# Patient Record
Sex: Female | Born: 2000 | Race: White | Hispanic: No | Marital: Single | Smoking: Never smoker
Health system: Southern US, Community
[De-identification: ages and names within clinical notes are randomized; demographics above are authoritative.]

## PROBLEM LIST (undated history)

## (undated) DIAGNOSIS — J45909 Unspecified asthma, uncomplicated: Secondary | ICD-10-CM

## (undated) HISTORY — PX: ADENOIDECTOMY: SUR15

## (undated) HISTORY — DX: Unspecified asthma, uncomplicated: J45.909

## (undated) HISTORY — PX: TONSILLECTOMY: SUR1361

---
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
## 2022-01-02 DIAGNOSIS — F411 Generalized anxiety disorder: Secondary | ICD-10-CM | POA: Diagnosis not present

## 2022-01-02 DIAGNOSIS — F331 Major depressive disorder, recurrent, moderate: Secondary | ICD-10-CM | POA: Diagnosis not present

## 2022-01-04 ENCOUNTER — Encounter: Payer: Self-pay | Admitting: Nurse Practitioner

## 2022-01-04 ENCOUNTER — Ambulatory Visit: Payer: BC Managed Care – PPO | Admitting: Nurse Practitioner

## 2022-01-04 VITALS — BP 124/77 | HR 118 | Temp 98.8°F | Ht 66.0 in | Wt 225.0 lb

## 2022-01-04 DIAGNOSIS — N764 Abscess of vulva: Secondary | ICD-10-CM

## 2022-01-04 NOTE — Patient Instructions (Signed)
Skin Abscess  A skin abscess is an infected area of your skin that contains pus and other material. An abscess can happen in any part of your body. Some abscesses break open (rupture) on their own. Most continue to get worse unless they are treated. The infection can spread deeper into the body and into your blood, which can make you feel sick. A skin abscess is caused by germs that enter the skin through a cut or scrape. It can also be caused by blocked oil and sweat glands or infected hair follicles. This condition is usually treated by: Draining the pus. Taking antibiotic medicines. Placing a warm, wet washcloth over the abscess. Follow these instructions at home: Medicines  Take over-the-counter and prescription medicines only as told by your doctor. If you were prescribed an antibiotic medicine, take it as told by your doctor. Do not stop taking the antibiotic even if you start to feel better. Abscess care  If you have an abscess that has not drained, place a warm, clean, wet washcloth over the abscess several times a day. Do this as told by your doctor. Follow instructions from your doctor about how to take care of your abscess. Make sure you: Cover the abscess with a bandage (dressing). Change your bandage or gauze as told by your doctor. Wash your hands with soap and water before you change the bandage or gauze. If you cannot use soap and water, use hand sanitizer. Check your abscess every day for signs that the infection is getting worse. Check for: More redness, swelling, or pain. More fluid or blood. Warmth. More pus or a bad smell. General instructions To avoid spreading the infection: Do not share personal care items, towels, or hot tubs with others. Avoid making skin-to-skin contact with other people. Keep all follow-up visits as told by your doctor. This is important. Contact a doctor if: You have more redness, swelling, or pain around your abscess. You have more fluid  or blood coming from your abscess. Your abscess feels warm when you touch it. You have more pus or a bad smell coming from your abscess. Your muscles ache. You feel sick. Get help right away if: You have very bad (severe) pain. You see red streaks on your skin spreading away from the abscess. You see redness that spreads quickly. You have a fever or chills. Summary A skin abscess is an infected area of your skin that contains pus and other material. The abscess is caused by germs that enter the skin through a cut or scrape. It can also be caused by blocked oil and sweat glands or infected hair follicles. Follow your doctor's instructions on caring for your abscess, taking medicines, preventing infections, and keeping follow-up visits. This information is not intended to replace advice given to you by your health care provider. Make sure you discuss any questions you have with your health care provider. Document Revised: 04/02/2021 Document Reviewed: 04/02/2021 Elsevier Patient Education  2023 Elsevier Inc.  

## 2022-01-04 NOTE — Progress Notes (Signed)
Acute Office Visit  Subjective:     Patient ID: Christine Pope, female    DOB: 2000-10-18, 21 y.o.   MRN: [id number]  Chief Complaint  Patient presents with   Follow-up    HPI   Patient is in today for follow up skin abscess on left labia.  We reviewed the etiology of recurrent abscesses of skin.  Skin abscesses are collections of pus within the dermis and deeper skin tissues. Skin abscesses manifest as painful, tender, fluctuant, and erythematous nodules, frequently surmounted by a pustule and surrounded by a rim of erythematous swelling.  Spontaneous drainage of purulent material may occur.  Fever can occur on occasion.   -Skin abscesses can develop in healthy individuals with no predisposing conditions other than skin or nasal carriage of Staphylococcus aureus.    In addition, any process leading to a breach in the skin barrier can also predispose to the development of a skin abscesses, such as atopic dermatitis.      Review of Systems  Constitutional: Negative.  Negative for chills and fever.  HENT: Negative.    Respiratory: Negative.    Cardiovascular: Negative.   Gastrointestinal: Negative.   Genitourinary: Negative.   Skin: Negative.        Abscess  All other systems reviewed and are negative.       Objective:    BP 124/77   Pulse (!) 118   Temp 98.8 F (37.1 C)   Ht 5\' 6"  (1.676 m)   Wt 225 lb (102.1 kg)   SpO2 96%   BMI 36.32 kg/m  BP Readings from Last 3 Encounters:  01/04/22 124/77  12/31/21 123/84  01/05/21 (!) 141/92 (>99 %, Z >2.33 /  >99 %, Z >2.33)*   *BP percentiles are based on the 2017 AAP Clinical Practice Guideline for girls   Wt Readings from Last 3 Encounters:  01/04/22 225 lb (102.1 kg) (99 %, Z= 2.25)*  12/31/21 228 lb (103.4 kg) (99 %, Z= 2.28)*  01/05/21 (!) 228 lb (103.4 kg) (99 %, Z= 2.28)*   * Growth percentiles are based on CDC (Girls, 2-20 Years) data.      Physical Exam Vitals and nursing note reviewed.  Constitutional:       Appearance: Normal appearance.  HENT:     Head: Normocephalic.     Right Ear: External ear normal.     Left Ear: External ear normal.     Nose: Nose normal.  Eyes:     Conjunctiva/sclera: Conjunctivae normal.  Cardiovascular:     Pulses: Normal pulses.     Heart sounds: Normal heart sounds.  Pulmonary:     Effort: Pulmonary effort is normal.     Breath sounds: Normal breath sounds.  Abdominal:     General: Bowel sounds are normal.  Skin:    Findings: Abscess and erythema present.  Neurological:     Mental Status: She is alert and oriented to person, place, and time.     No results found for any visits on 01/04/22.      Assessment & Plan:  Abscess resolved.  Education provided printed handouts given.  Advised patient to complete antibiotic as prescribed.  For mild nausea continue Zofran as these are GI side effects from taking antibiotics.  Patient verbalized understanding and knows to follow-up with worsening unresolved symptoms. Problem List Items Addressed This Visit   None Visit Diagnoses     Abscess of left genital labia    -  Primary  No orders of the defined types were placed in this encounter.   Return if symptoms worsen or fail to improve.  Ivy Lynn, NP

## 2022-01-14 ENCOUNTER — Other Ambulatory Visit: Payer: Self-pay | Admitting: Nurse Practitioner

## 2022-01-14 DIAGNOSIS — M26623 Arthralgia of bilateral temporomandibular joint: Secondary | ICD-10-CM

## 2022-01-16 DIAGNOSIS — F331 Major depressive disorder, recurrent, moderate: Secondary | ICD-10-CM | POA: Diagnosis not present

## 2022-01-16 DIAGNOSIS — F411 Generalized anxiety disorder: Secondary | ICD-10-CM | POA: Diagnosis not present

## 2022-01-22 DIAGNOSIS — M79671 Pain in right foot: Secondary | ICD-10-CM | POA: Diagnosis not present

## 2022-01-22 DIAGNOSIS — M2142 Flat foot [pes planus] (acquired), left foot: Secondary | ICD-10-CM | POA: Diagnosis not present

## 2022-01-22 DIAGNOSIS — M2141 Flat foot [pes planus] (acquired), right foot: Secondary | ICD-10-CM | POA: Diagnosis not present

## 2022-01-22 DIAGNOSIS — M79672 Pain in left foot: Secondary | ICD-10-CM | POA: Diagnosis not present

## 2022-01-24 DIAGNOSIS — F411 Generalized anxiety disorder: Secondary | ICD-10-CM | POA: Diagnosis not present

## 2022-01-24 DIAGNOSIS — F331 Major depressive disorder, recurrent, moderate: Secondary | ICD-10-CM | POA: Diagnosis not present

## 2022-01-29 DIAGNOSIS — F411 Generalized anxiety disorder: Secondary | ICD-10-CM | POA: Diagnosis not present

## 2022-01-29 DIAGNOSIS — F331 Major depressive disorder, recurrent, moderate: Secondary | ICD-10-CM | POA: Diagnosis not present

## 2022-01-31 ENCOUNTER — Encounter: Payer: Self-pay | Admitting: Nurse Practitioner

## 2022-01-31 ENCOUNTER — Ambulatory Visit: Payer: BC Managed Care – PPO | Admitting: Nurse Practitioner

## 2022-01-31 ENCOUNTER — Other Ambulatory Visit: Payer: Self-pay | Admitting: Nurse Practitioner

## 2022-01-31 VITALS — BP 133/87 | HR 99 | Temp 97.8°F | Resp 20 | Ht 66.0 in | Wt 223.0 lb

## 2022-01-31 DIAGNOSIS — R7982 Elevated C-reactive protein (CRP): Secondary | ICD-10-CM

## 2022-01-31 DIAGNOSIS — M2550 Pain in unspecified joint: Secondary | ICD-10-CM

## 2022-01-31 DIAGNOSIS — M26623 Arthralgia of bilateral temporomandibular joint: Secondary | ICD-10-CM

## 2022-01-31 DIAGNOSIS — M26603 Bilateral temporomandibular joint disorder, unspecified: Secondary | ICD-10-CM

## 2022-01-31 DIAGNOSIS — S0300XD Dislocation of jaw, unspecified side, subsequent encounter: Secondary | ICD-10-CM

## 2022-01-31 DIAGNOSIS — K219 Gastro-esophageal reflux disease without esophagitis: Secondary | ICD-10-CM

## 2022-01-31 MED ORDER — PANTOPRAZOLE SODIUM 20 MG PO TBEC: 20.0000 mg | DELAYED_RELEASE_TABLET | Freq: Every day | ORAL | 2 refills | Status: AC

## 2022-01-31 NOTE — Patient Instructions (Signed)
C-Reactive Protein Test Why am I having this test? The C-reactive protein (CRP) is a substance that the liver releases in response to inflammation within the body. You may have a CRP test to help diagnose: Serious bacterial or fungal infections. Inflammatory diseases, such as inflammatory bowel disease. Lupus, rheumatoid arthritis, or other autoimmune diseases. What is being tested? This test checks for the level of CRP in your blood. The level of CRP in your body increases greatly after a heart attack, infection, or injury. What kind of sample is taken?  A blood sample is required for this test. It is usually collected by inserting a needle into a blood vessel. Tell a health care provider about: All medicines you are taking, including vitamins, herbs, eye drops, creams, and over-the-counter medicines. Any medical conditions you have. The use of birth control pills or hormone replacement therapy such as estrogen. Any blood disorders you have. Whether you are pregnant or may be pregnant. How are the results reported? Your test results will be reported as a value that indicates how much CRP is in your blood. This will be reported as milligrams of CRP per liter (mg/L) of blood. Your health care provider will compare your results to normal ranges that were established after testing a large group of people (reference ranges). Reference ranges may vary among labs and hospitals. For this test, the standard CRP reference value is less than 10 mg/L. What do the results mean? A standard CRP test result that is less than 10 mg/L is considered normal, meaning that you do not have an abnormally high level of inflammation in your body. A standard CRP test result that is greater than 10 mg/L means that there is an abnormally high level of inflammation in your body that is causing CRP to be released. Inflammation may result from many different conditions or injuries. You will have more tests to help make a  diagnosis. Talk with your health care provider about what your results mean. Questions to ask your health care provider Ask your health care provider, or the department that is doing the test: When will my results be ready? How will I get my results? What are my treatment options? What other tests do I need? What are my next steps? Summary C-reactive protein (CPR) is a substance released by the liver in response to inflammation within the body. The CRP test may be performed to help diagnose infection and inflammatory conditions. Talk with your health care provider about what your results mean. This information is not intended to replace advice given to you by your health care provider. Make sure you discuss any questions you have with your health care provider. Document Revised: 04/19/2020 Document Reviewed: 04/19/2020 Elsevier Patient Education  2023 [employer].

## 2022-01-31 NOTE — Progress Notes (Signed)
Subjective:    Patient ID: Christine Pope, female    DOB: 10-04-2000, 21 y.o.   MRN: [id number]   Chief Complaint: GERD  HPI Patient has several complaints:  -gerd.-She is currently on no medication for this. She has been on flexeril daily for her jaw pain and now she is having heart burn, feels like she cant eat, and occasional abdominal pain. 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 Even though hypermobility has nothing tio do with her stomach. She has had endoscopies in the past. 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 Flexeril is the only thing that helps her jaw pain. - frequent fatigue- says that if she goes out an about 1 day then she has to sleep for days afterwards to recover.     Review of Systems  Constitutional:  Negative for diaphoresis.  Eyes:  Negative for pain.  Respiratory:  Negative for shortness of breath.   Cardiovascular:  Negative for chest pain, palpitations and leg swelling.  Gastrointestinal:  Negative for abdominal pain.  Endocrine: Negative for polydipsia.  Skin:  Negative for rash.  Neurological:  Negative for dizziness, weakness and headaches.  Hematological:  Does not bruise/bleed easily.  All other systems reviewed and are negative.      Objective:   Physical Exam Vitals and nursing note reviewed.  Constitutional:      General: She is not in acute distress.    Appearance: Normal appearance. She is well-developed.  HENT:     Head: Normocephalic.     Right Ear: Tympanic membrane normal.     Left Ear: Tympanic membrane normal.     Ears:     Comments: Bil jaw pain on opening and closing of jaw- crepitus felt.    Nose: Nose normal.     Mouth/Throat:     Mouth: Mucous membranes are moist.  Eyes:     Pupils: Pupils are equal, round, and reactive to light.  Neck:     Vascular: No carotid bruit or JVD.  Cardiovascular:     Rate and Rhythm: Normal rate and regular rhythm.      Heart sounds: Normal heart sounds.  Pulmonary:     Effort: Pulmonary effort is normal. No respiratory distress.     Breath sounds: Normal breath sounds. No wheezing or rales.  Chest:     Chest wall: No tenderness.  Abdominal:     General: Bowel sounds are normal. There is no distension or abdominal bruit.     Palpations: Abdomen is soft. There is no hepatomegaly, splenomegaly, mass or pulsatile mass.     Tenderness: There is no abdominal tenderness.  Musculoskeletal:        General: Normal range of motion.     Cervical back: Normal range of motion and neck supple.  Lymphadenopathy:     Cervical: No cervical adenopathy.  Skin:    General: Skin is warm and dry.  Neurological:     Mental Status: She is alert and oriented to person, place, and time.     Deep Tendon Reflexes: Reflexes are normal and symmetric.  Psychiatric:        Behavior: Behavior normal.        Thought Content: Thought content normal.        Judgment: Judgment normal.     BP 133/87   Pulse 99   Temp 97.8 F (36.6 C) (Temporal)   Resp 20   Ht  $'5\' 6"'R$  (1.676 m)   Wt 223 lb (101.2 kg)   SpO2 94%   BMI 35.99 kg/m        Assessment & Plan:   Christine Pope in today with chief complaint of Medical Management of Chronic Issues   1. Hypermobility arthralgia Labs pending - Ambulatory referral to Rheumatology - CBC with Differential/Platelet - CMP14+EGFR  2. Gastroesophageal reflux disease without esophagitis Avoid spicy foods Do not eat 2 hours prior to bedtime Only take flexeril at night and see how you do - pantoprazole (PROTONIX) 20 MG tablet; Take 1 tablet (20 mg total) by mouth daily.  Dispense: 30 tablet; Refill: 2  3. Elevated C-reactive protein (CRP) Labs pending - C-reactive protein  4. Dislocation of temporomandibular joint, subsequent encounter Avoid foods that require a lot of chewing Avoid gum Ice as needed - Ambulatory referral to Oral Maxillofacial Surgery    The above  assessment and management plan was discussed with the patient. The patient verbalized understanding of and has agreed to the management plan. Patient is aware to call the clinic if symptoms persist or worsen. Patient is aware when to return to the clinic for a follow-up visit. Patient educated on when it is appropriate to go to the emergency department.   Mary-Margaret Hassell Done, FNP

## 2022-02-01 LAB — CBC WITH DIFFERENTIAL/PLATELET
Basophils Absolute: 0.1 10*3/uL (ref 0.0–0.2)
Basos: 1 %
EOS (ABSOLUTE): 0.1 10*3/uL (ref 0.0–0.4)
Eos: 1 %
Hematocrit: 36.7 % (ref 34.0–46.6)
Hemoglobin: 11.9 g/dL (ref 11.1–15.9)
Immature Grans (Abs): 0 10*3/uL (ref 0.0–0.1)
Immature Granulocytes: 0 %
Lymphocytes Absolute: 3.8 10*3/uL — ABNORMAL HIGH (ref 0.7–3.1)
Lymphs: 37 %
MCH: 26.4 pg — ABNORMAL LOW (ref 26.6–33.0)
MCHC: 32.4 g/dL (ref 31.5–35.7)
MCV: 82 fL (ref 79–97)
Monocytes Absolute: 0.5 10*3/uL (ref 0.1–0.9)
Monocytes: 4 %
Neutrophils Absolute: 5.9 10*3/uL (ref 1.4–7.0)
Neutrophils: 57 %
Platelets: 413 10*3/uL (ref 150–450)
RBC: 4.5 x10E6/uL (ref 3.77–5.28)
RDW: 14.9 % (ref 11.7–15.4)
WBC: 10.4 10*3/uL (ref 3.4–10.8)

## 2022-02-01 LAB — CMP14+EGFR
ALT: 24 IU/L (ref 0–32)
AST: 13 IU/L (ref 0–40)
Albumin/Globulin Ratio: 1.6 (ref 1.2–2.2)
Albumin: 4.2 g/dL (ref 4.0–5.0)
Alkaline Phosphatase: 99 IU/L (ref 42–106)
BUN/Creatinine Ratio: 12 (ref 9–23)
BUN: 9 mg/dL (ref 6–20)
Bilirubin Total: <0.2 mg/dL (ref 0.0–1.2)
CO2: 18 mmol/L — ABNORMAL LOW (ref 20–29)
Calcium: 9.2 mg/dL (ref 8.7–10.2)
Chloride: 105 mmol/L (ref 96–106)
Creatinine, Ser: 0.78 mg/dL (ref 0.57–1.00)
Globulin, Total: 2.6 g/dL (ref 1.5–4.5)
Glucose: 97 mg/dL (ref 70–99)
Potassium: 4.2 mmol/L (ref 3.5–5.2)
Sodium: 139 mmol/L (ref 134–144)
Total Protein: 6.8 g/dL (ref 6.0–8.5)
eGFR: 113 mL/min/{1.73_m2} (ref >59)

## 2022-02-01 LAB — C-REACTIVE PROTEIN: CRP: 9 mg/L (ref 0–10)

## 2022-02-08 DIAGNOSIS — F331 Major depressive disorder, recurrent, moderate: Secondary | ICD-10-CM | POA: Diagnosis not present

## 2022-02-08 DIAGNOSIS — F411 Generalized anxiety disorder: Secondary | ICD-10-CM | POA: Diagnosis not present

## 2022-02-11 DIAGNOSIS — M25561 Pain in right knee: Secondary | ICD-10-CM | POA: Diagnosis not present

## 2022-02-11 DIAGNOSIS — M25562 Pain in left knee: Secondary | ICD-10-CM | POA: Diagnosis not present

## 2022-02-11 DIAGNOSIS — M222X1 Patellofemoral disorders, right knee: Secondary | ICD-10-CM | POA: Diagnosis not present

## 2022-02-11 DIAGNOSIS — M2241 Chondromalacia patellae, right knee: Secondary | ICD-10-CM | POA: Diagnosis not present

## 2022-02-11 DIAGNOSIS — M2242 Chondromalacia patellae, left knee: Secondary | ICD-10-CM | POA: Diagnosis not present

## 2022-02-11 DIAGNOSIS — M222X2 Patellofemoral disorders, left knee: Secondary | ICD-10-CM | POA: Diagnosis not present

## 2022-02-13 DIAGNOSIS — F411 Generalized anxiety disorder: Secondary | ICD-10-CM | POA: Diagnosis not present

## 2022-02-13 DIAGNOSIS — F331 Major depressive disorder, recurrent, moderate: Secondary | ICD-10-CM | POA: Diagnosis not present

## 2022-02-14 DIAGNOSIS — M222X2 Patellofemoral disorders, left knee: Secondary | ICD-10-CM | POA: Diagnosis not present

## 2022-02-14 DIAGNOSIS — M2241 Chondromalacia patellae, right knee: Secondary | ICD-10-CM | POA: Diagnosis not present

## 2022-02-14 DIAGNOSIS — M2242 Chondromalacia patellae, left knee: Secondary | ICD-10-CM | POA: Diagnosis not present

## 2022-02-14 DIAGNOSIS — M25562 Pain in left knee: Secondary | ICD-10-CM | POA: Diagnosis not present

## 2022-02-14 DIAGNOSIS — M222X1 Patellofemoral disorders, right knee: Secondary | ICD-10-CM | POA: Diagnosis not present

## 2022-02-14 DIAGNOSIS — M25561 Pain in right knee: Secondary | ICD-10-CM | POA: Diagnosis not present

## 2022-02-22 DIAGNOSIS — F331 Major depressive disorder, recurrent, moderate: Secondary | ICD-10-CM | POA: Diagnosis not present

## 2022-02-22 DIAGNOSIS — F411 Generalized anxiety disorder: Secondary | ICD-10-CM | POA: Diagnosis not present

## 2022-03-01 DIAGNOSIS — F331 Major depressive disorder, recurrent, moderate: Secondary | ICD-10-CM | POA: Diagnosis not present

## 2022-03-01 DIAGNOSIS — F411 Generalized anxiety disorder: Secondary | ICD-10-CM | POA: Diagnosis not present

## 2022-03-06 ENCOUNTER — Other Ambulatory Visit: Payer: Self-pay | Admitting: Nurse Practitioner

## 2022-03-06 DIAGNOSIS — N946 Dysmenorrhea, unspecified: Secondary | ICD-10-CM | POA: Diagnosis not present

## 2022-03-06 DIAGNOSIS — F411 Generalized anxiety disorder: Secondary | ICD-10-CM | POA: Diagnosis not present

## 2022-03-06 DIAGNOSIS — F331 Major depressive disorder, recurrent, moderate: Secondary | ICD-10-CM | POA: Diagnosis not present

## 2022-03-07 DIAGNOSIS — M25562 Pain in left knee: Secondary | ICD-10-CM | POA: Diagnosis not present

## 2022-03-07 DIAGNOSIS — M2242 Chondromalacia patellae, left knee: Secondary | ICD-10-CM | POA: Diagnosis not present

## 2022-03-07 DIAGNOSIS — M25561 Pain in right knee: Secondary | ICD-10-CM | POA: Diagnosis not present

## 2022-03-07 DIAGNOSIS — M222X1 Patellofemoral disorders, right knee: Secondary | ICD-10-CM | POA: Diagnosis not present

## 2022-03-07 DIAGNOSIS — M222X2 Patellofemoral disorders, left knee: Secondary | ICD-10-CM | POA: Diagnosis not present

## 2022-03-07 DIAGNOSIS — M2241 Chondromalacia patellae, right knee: Secondary | ICD-10-CM | POA: Diagnosis not present

## 2022-03-19 DIAGNOSIS — M2550 Pain in unspecified joint: Secondary | ICD-10-CM | POA: Diagnosis not present

## 2022-03-19 DIAGNOSIS — R768 Other specified abnormal immunological findings in serum: Secondary | ICD-10-CM | POA: Diagnosis not present

## 2022-03-19 DIAGNOSIS — R5382 Chronic fatigue, unspecified: Secondary | ICD-10-CM | POA: Diagnosis not present

## 2022-03-19 DIAGNOSIS — M357 Hypermobility syndrome: Secondary | ICD-10-CM | POA: Diagnosis not present

## 2022-04-04 ENCOUNTER — Encounter: Payer: Self-pay | Admitting: Nurse Practitioner

## 2022-04-04 ENCOUNTER — Ambulatory Visit (INDEPENDENT_AMBULATORY_CARE_PROVIDER_SITE_OTHER): Payer: BC Managed Care – PPO | Admitting: Nurse Practitioner

## 2022-04-04 VITALS — BP 127/82 | HR 98 | Temp 95.8°F | Ht 66.01 in | Wt 223.8 lb

## 2022-04-04 DIAGNOSIS — R110 Nausea: Secondary | ICD-10-CM

## 2022-04-04 DIAGNOSIS — M2559 Pain in other specified joint: Secondary | ICD-10-CM

## 2022-04-04 DIAGNOSIS — L299 Pruritus, unspecified: Secondary | ICD-10-CM | POA: Diagnosis not present

## 2022-04-04 DIAGNOSIS — F5101 Primary insomnia: Secondary | ICD-10-CM

## 2022-04-04 MED ORDER — CELECOXIB 200 MG PO CAPS: 200.0000 mg | ORAL_CAPSULE | Freq: Two times a day (BID) | ORAL | 5 refills | Status: DC

## 2022-04-04 MED ORDER — ONDANSETRON 4 MG PO TBDP: 4.0000 mg | ORAL_TABLET | Freq: Three times a day (TID) | ORAL | 0 refills | Status: AC | PRN

## 2022-04-04 MED ORDER — MIRTAZAPINE 15 MG PO TABS: 15.0000 mg | ORAL_TABLET | Freq: Every day | ORAL | 2 refills | Status: DC

## 2022-04-04 NOTE — Patient Instructions (Signed)
SARNA LOTION  Pruritus Pruritus is an itchy feeling on the skin. One of the most common causes is dry skin, but many different things can cause itching. Most cases of itching do not require medical attention. Sometimes itchy skin can turn into a rash or a secondary infection. Follow these instructions at home: Skin care  Do not use scented soaps, detergents, perfumes, and cosmetic products. Instead, use gentle, unscented versions of these items. Apply moisturizing creams to your skin frequently, at least twice daily. Apply immediately after bathing while skin is still wet. Take medicines or apply medicated creams only as told by your health care provider. This may include: Corticosteroid cream or topical calcineurin inhibitor. Anti-itch lotions containing urea, camphor, or menthol. Oral antihistamines. Do not take hot showers or baths, which can make itching worse. A short, cool shower may help with itching as long as you apply moisturizing lotion after the shower. Apply a cool, wet cloth (cool compress) to the affected areas. You may take lukewarm baths with one of the following: Epsom salts. You can get these at your local pharmacy or grocery store. Follow the instructions on the packaging. Baking soda. Pour a small amount into the bath as told by your health care provider. Colloidal oatmeal. You can get this at your local pharmacy or grocery store. Follow the instructions on the packaging. Do not scratch your skin. General instructions Avoid wearing tight clothes. Keep a journal to help find out what is causing your itching. Write down: What you eat and drink. What cosmetic products you use. What soaps or detergents you use. What you wear, including jewelry. Use a humidifier. This keeps the air moist, which helps to prevent dry skin. Be aware of any changes in your itchiness. Tell your health care provider about any changes. Contact a health care provider if: The itching does not go  away after several days. You notice redness, warmth, or drainage on the skin where you have scratched. You are unusually thirsty or urinating more than normal. Your skin tingles or feels numb. Your skin or the white parts of your eyes turn yellow (jaundice). You feel weak. You have any of the following: Night sweats. Tiredness (fatigue). Weight loss. Abdominal pain. Summary Pruritus is an itchy feeling on the skin. One of the most common causes is dry skin, but many different conditions and factors can cause itching. Apply moisturizing creams to your skin frequently, at least twice daily. Apply immediately after bathing while skin is still wet. Take medicines or apply medicated creams only as told by your health care provider. Do not take hot showers or baths. Do not use scented soaps, detergents, perfumes, or cosmetic products. Keep a journal to help find out what is causing your itching. This information is not intended to replace advice given to you by your health care provider. Make sure you discuss any questions you have with your health care provider. Document Revised: 08/01/2021 Document Reviewed: 08/01/2021 Elsevier Patient Education  2023 Elsevier Inc.  

## 2022-04-04 NOTE — Progress Notes (Signed)
Subjective:    Patient ID: Christine Pope, female    DOB: 2000/09/15, 21 y.o.   MRN: [id number]   Chief Complaint: Insomnia and Nausea   Insomnia   Patient is brought in by her mom with 2 complaints: - insomnia- patient says she does not sleep well because of her constant joint pain, she has tried melatonin, naproxyn and biofreeze. She says it is a ache in both legs- she is doing physical therapy. She saw rheumatology and they said there was nothing they could do.  - nausea- she has an appointment with GI in NOvember. She is on zofran and bentyl prescribed by GI.she is out of zofran. She needs refill until see can see them again.    Review of Systems  Constitutional:  Negative for diaphoresis.  Eyes:  Negative for pain.  Respiratory:  Negative for shortness of breath.   Cardiovascular:  Negative for chest pain, palpitations and leg swelling.  Gastrointestinal:  Negative for abdominal pain.  Endocrine: Negative for polydipsia.  Skin:  Negative for rash.  Neurological:  Negative for dizziness, weakness and headaches.  Hematological:  Does not bruise/bleed easily.  Psychiatric/Behavioral:  The patient has insomnia.   All other systems reviewed and are negative.      Objective:   Physical Exam Vitals and nursing note reviewed.  Constitutional:      General: She is not in acute distress.    Appearance: Normal appearance. She is well-developed.  Neck:     Vascular: No carotid bruit or JVD.  Cardiovascular:     Rate and Rhythm: Normal rate and regular rhythm.     Heart sounds: Normal heart sounds.  Pulmonary:     Effort: Pulmonary effort is normal. No respiratory distress.     Breath sounds: Normal breath sounds. No wheezing or rales.  Chest:     Chest wall: No tenderness.  Abdominal:     General: Bowel sounds are normal. There is no distension or abdominal bruit.     Palpations: Abdomen is soft. There is no hepatomegaly, splenomegaly, mass or pulsatile mass.      Tenderness: There is no abdominal tenderness.  Musculoskeletal:        General: Normal range of motion.     Cervical back: Normal range of motion and neck supple.  Lymphadenopathy:     Cervical: No cervical adenopathy.  Skin:    General: Skin is warm and dry.  Neurological:     Mental Status: She is alert and oriented to person, place, and time.     Deep Tendon Reflexes: Reflexes are normal and symmetric.  Psychiatric:        Behavior: Behavior normal.        Thought Content: Thought content normal.        Judgment: Judgment normal.     BP 127/82   Pulse 98   Temp (!) 95.8 F (35.4 C) (Temporal)   Ht 5' 6.01" (1.677 m)   Wt 223 lb 12.8 oz (101.5 kg)   SpO2 96%   BMI 36.11 kg/m        Assessment & Plan:   Christine Pope in today with chief complaint of Insomnia (Not sleeping due to bilateral jaw and knee pain ) and Nausea (X 3 months )   1. Pain in other joint Takes MEDS BID - celecoxib (CELEBREX) 200 MG capsule; Take 1 capsule (200 mg total) by mouth 2 (two) times daily.  Dispense: 60 capsule; Refill: 5  2. Primary insomnia Bedtime  routine - mirtazapine (REMERON) 15 MG tablet; Take 1 tablet (15 mg total) by mouth at bedtime.  Dispense: 30 tablet; Refill: 2  3. Nausea Keep appointmnet with GI - ondansetron (ZOFRAN-ODT) 4 MG disintegrating tablet; Take 1 tablet (4 mg total) by mouth every 8 (eight) hours as needed for nausea or vomiting.  Dispense: 20 tablet; Refill: 0  4. Pruritus Claritin daily - Ambulatory referral to Allergy    The above assessment and management plan was discussed with the patient. The patient verbalized understanding of and has agreed to the management plan. Patient is aware to call the clinic if symptoms persist or worsen. Patient is aware when to return to the clinic for a follow-up visit. Patient educated on when it is appropriate to go to the emergency department.   Mary-Margaret Hassell Done, FNP

## 2022-04-08 DIAGNOSIS — L509 Urticaria, unspecified: Secondary | ICD-10-CM | POA: Diagnosis not present

## 2022-04-08 DIAGNOSIS — L299 Pruritus, unspecified: Secondary | ICD-10-CM | POA: Diagnosis not present

## 2022-04-08 DIAGNOSIS — L700 Acne vulgaris: Secondary | ICD-10-CM | POA: Diagnosis not present

## 2022-04-08 DIAGNOSIS — L739 Follicular disorder, unspecified: Secondary | ICD-10-CM | POA: Diagnosis not present

## 2022-04-29 DIAGNOSIS — M25562 Pain in left knee: Secondary | ICD-10-CM | POA: Diagnosis not present

## 2022-04-29 DIAGNOSIS — M25561 Pain in right knee: Secondary | ICD-10-CM | POA: Diagnosis not present

## 2022-04-29 DIAGNOSIS — M2480 Other specific joint derangements of unspecified joint, not elsewhere classified: Secondary | ICD-10-CM | POA: Diagnosis not present

## 2022-04-29 DIAGNOSIS — M2241 Chondromalacia patellae, right knee: Secondary | ICD-10-CM | POA: Diagnosis not present

## 2022-05-14 DIAGNOSIS — R700 Elevated erythrocyte sedimentation rate: Secondary | ICD-10-CM | POA: Diagnosis not present

## 2022-05-14 DIAGNOSIS — R109 Unspecified abdominal pain: Secondary | ICD-10-CM | POA: Diagnosis not present

## 2022-05-14 DIAGNOSIS — Z1389 Encounter for screening for other disorder: Secondary | ICD-10-CM | POA: Diagnosis not present

## 2022-05-14 DIAGNOSIS — R1084 Generalized abdominal pain: Secondary | ICD-10-CM | POA: Diagnosis not present

## 2022-05-14 DIAGNOSIS — R7982 Elevated C-reactive protein (CRP): Secondary | ICD-10-CM | POA: Diagnosis not present

## 2022-05-14 DIAGNOSIS — R9431 Abnormal electrocardiogram [ECG] [EKG]: Secondary | ICD-10-CM | POA: Diagnosis not present

## 2022-05-16 DIAGNOSIS — I499 Cardiac arrhythmia, unspecified: Secondary | ICD-10-CM | POA: Diagnosis not present

## 2022-05-16 DIAGNOSIS — R1084 Generalized abdominal pain: Secondary | ICD-10-CM | POA: Diagnosis not present

## 2022-05-23 ENCOUNTER — Ambulatory Visit: Payer: BC Managed Care – PPO | Admitting: Nurse Practitioner

## 2022-05-23 ENCOUNTER — Encounter: Payer: Self-pay | Admitting: Nurse Practitioner

## 2022-05-23 VITALS — BP 154/97 | HR 109 | Temp 96.8°F | Resp 20 | Ht 66.0 in | Wt 221.0 lb

## 2022-05-23 DIAGNOSIS — H6123 Impacted cerumen, bilateral: Secondary | ICD-10-CM | POA: Diagnosis not present

## 2022-05-23 DIAGNOSIS — Z23 Encounter for immunization: Secondary | ICD-10-CM | POA: Diagnosis not present

## 2022-05-23 NOTE — Patient Instructions (Signed)

## 2022-05-23 NOTE — Progress Notes (Signed)
   Subjective:    Patient ID: Christine Pope, female    DOB: March 19, 2001, 21 y.o.   MRN: [id number]   Chief Complaint: Right ear feels stopped up   Ear Fullness  There is pain in the right ear. This is a new problem. The current episode started in the past 7 days (3-4 days). The problem occurs constantly. The problem has been gradually worsening. There has been no fever. The patient is experiencing no pain (fullness and pressure). Pertinent negatives include no abdominal pain, coughing, ear discharge, headaches, rhinorrhea or sore throat. Treatments tried: debrox drops. The treatment provided no relief.       Review of Systems  Constitutional:  Negative for chills and fever.  HENT:  Negative for ear discharge, rhinorrhea and sore throat.   Eyes:  Negative for pain.  Respiratory:  Negative for cough and shortness of breath.   Cardiovascular:  Negative for chest pain.  Gastrointestinal:  Negative for abdominal pain.  Neurological:  Negative for headaches.  All other systems reviewed and are negative.      Objective:   Physical Exam Vitals and nursing note reviewed.  Constitutional:      General: She is not in acute distress.    Appearance: Normal appearance. She is not toxic-appearing.  HENT:     Head: Normocephalic and atraumatic.     Right Ear: There is impacted cerumen.     Left Ear: There is impacted cerumen.     Ears:     Comments: After irrigation, mid ear effusion seen in R ear    Nose: Nose normal.     Mouth/Throat:     Mouth: Mucous membranes are moist.     Pharynx: Oropharynx is clear.  Eyes:     Conjunctiva/sclera: Conjunctivae normal.     Pupils: Pupils are equal, round, and reactive to light.  Cardiovascular:     Rate and Rhythm: Regular rhythm.     Pulses: Normal pulses.     Heart sounds: Normal heart sounds.  Pulmonary:     Effort: Pulmonary effort is normal. No respiratory distress.     Breath sounds: Normal breath sounds.  Skin:    General: Skin is  warm and dry.  Neurological:     General: No focal deficit present.     Mental Status: She is alert and oriented to person, place, and time.  Psychiatric:        Mood and Affect: Mood normal.        Behavior: Behavior normal.      BP (!) 154/97   Pulse (!) 109   Temp (!) 96.8 F (36 C) (Temporal)   Resp 20   Ht 5\' 6"  (1.676 m)   Wt 221 lb (100.2 kg)   SpO2 95%   BMI 35.67 kg/m   S/P earlavage- TMS = clear effusion bil    Assessment & Plan:   Christine Pope in today with chief complaint of Right ear feels stopped up   1. Bilateral impacted cerumen Debrox several times a week Flonase nasal spray OTC RTO prn    The above assessment and management plan was discussed with the patient. The patient verbalized understanding of and has agreed to the management plan. Patient is aware to call the clinic if symptoms persist or worsen. Patient is aware when to return to the clinic for a follow-up visit. Patient educated on when it is appropriate to go to the emergency department.   Mary-Margaret Rudene Christians, FNP

## 2022-05-27 DIAGNOSIS — M25561 Pain in right knee: Secondary | ICD-10-CM | POA: Diagnosis not present

## 2022-05-27 DIAGNOSIS — M25562 Pain in left knee: Secondary | ICD-10-CM | POA: Diagnosis not present

## 2022-05-27 DIAGNOSIS — M2241 Chondromalacia patellae, right knee: Secondary | ICD-10-CM | POA: Diagnosis not present

## 2022-05-27 DIAGNOSIS — M2550 Pain in unspecified joint: Secondary | ICD-10-CM | POA: Diagnosis not present

## 2022-06-07 DIAGNOSIS — F331 Major depressive disorder, recurrent, moderate: Secondary | ICD-10-CM | POA: Diagnosis not present

## 2022-06-07 DIAGNOSIS — F411 Generalized anxiety disorder: Secondary | ICD-10-CM | POA: Diagnosis not present

## 2022-06-10 DIAGNOSIS — M2241 Chondromalacia patellae, right knee: Secondary | ICD-10-CM | POA: Diagnosis not present

## 2022-06-10 DIAGNOSIS — M2550 Pain in unspecified joint: Secondary | ICD-10-CM | POA: Diagnosis not present

## 2022-06-10 DIAGNOSIS — M25561 Pain in right knee: Secondary | ICD-10-CM | POA: Diagnosis not present

## 2022-06-10 DIAGNOSIS — M2480 Other specific joint derangements of unspecified joint, not elsewhere classified: Secondary | ICD-10-CM | POA: Diagnosis not present

## 2022-06-10 DIAGNOSIS — M25562 Pain in left knee: Secondary | ICD-10-CM | POA: Diagnosis not present

## 2022-06-17 DIAGNOSIS — F411 Generalized anxiety disorder: Secondary | ICD-10-CM | POA: Diagnosis not present

## 2022-06-17 DIAGNOSIS — F331 Major depressive disorder, recurrent, moderate: Secondary | ICD-10-CM | POA: Diagnosis not present

## 2022-06-21 DIAGNOSIS — F331 Major depressive disorder, recurrent, moderate: Secondary | ICD-10-CM | POA: Diagnosis not present

## 2022-06-21 DIAGNOSIS — F411 Generalized anxiety disorder: Secondary | ICD-10-CM | POA: Diagnosis not present

## 2022-06-24 DIAGNOSIS — M2550 Pain in unspecified joint: Secondary | ICD-10-CM | POA: Diagnosis not present

## 2022-06-24 DIAGNOSIS — M25561 Pain in right knee: Secondary | ICD-10-CM | POA: Diagnosis not present

## 2022-06-24 DIAGNOSIS — M25562 Pain in left knee: Secondary | ICD-10-CM | POA: Diagnosis not present

## 2022-06-24 DIAGNOSIS — M2241 Chondromalacia patellae, right knee: Secondary | ICD-10-CM | POA: Diagnosis not present

## 2022-06-24 DIAGNOSIS — M2480 Other specific joint derangements of unspecified joint, not elsewhere classified: Secondary | ICD-10-CM | POA: Diagnosis not present

## 2022-06-28 DIAGNOSIS — F331 Major depressive disorder, recurrent, moderate: Secondary | ICD-10-CM | POA: Diagnosis not present

## 2022-06-28 DIAGNOSIS — F411 Generalized anxiety disorder: Secondary | ICD-10-CM | POA: Diagnosis not present

## 2022-07-10 DIAGNOSIS — M25561 Pain in right knee: Secondary | ICD-10-CM | POA: Diagnosis not present

## 2022-07-10 DIAGNOSIS — M25562 Pain in left knee: Secondary | ICD-10-CM | POA: Diagnosis not present

## 2022-07-10 DIAGNOSIS — M2480 Other specific joint derangements of unspecified joint, not elsewhere classified: Secondary | ICD-10-CM | POA: Diagnosis not present

## 2022-07-10 DIAGNOSIS — M2241 Chondromalacia patellae, right knee: Secondary | ICD-10-CM | POA: Diagnosis not present

## 2022-07-12 DIAGNOSIS — F411 Generalized anxiety disorder: Secondary | ICD-10-CM | POA: Diagnosis not present

## 2022-07-12 DIAGNOSIS — F331 Major depressive disorder, recurrent, moderate: Secondary | ICD-10-CM | POA: Diagnosis not present

## 2022-07-19 DIAGNOSIS — F331 Major depressive disorder, recurrent, moderate: Secondary | ICD-10-CM | POA: Diagnosis not present

## 2022-07-19 DIAGNOSIS — F411 Generalized anxiety disorder: Secondary | ICD-10-CM | POA: Diagnosis not present

## 2022-07-23 DIAGNOSIS — Z79899 Other long term (current) drug therapy: Secondary | ICD-10-CM | POA: Diagnosis not present

## 2022-07-23 DIAGNOSIS — F32A Depression, unspecified: Secondary | ICD-10-CM | POA: Diagnosis not present

## 2022-07-23 DIAGNOSIS — R5382 Chronic fatigue, unspecified: Secondary | ICD-10-CM | POA: Diagnosis not present

## 2022-07-23 DIAGNOSIS — R760 Raised antibody titer: Secondary | ICD-10-CM | POA: Diagnosis not present

## 2022-07-23 DIAGNOSIS — E611 Iron deficiency: Secondary | ICD-10-CM | POA: Diagnosis not present

## 2022-07-23 DIAGNOSIS — M357 Hypermobility syndrome: Secondary | ICD-10-CM | POA: Diagnosis not present

## 2022-07-23 DIAGNOSIS — M2550 Pain in unspecified joint: Secondary | ICD-10-CM | POA: Diagnosis not present

## 2022-07-26 DIAGNOSIS — F331 Major depressive disorder, recurrent, moderate: Secondary | ICD-10-CM | POA: Diagnosis not present

## 2022-07-26 DIAGNOSIS — F411 Generalized anxiety disorder: Secondary | ICD-10-CM | POA: Diagnosis not present

## 2022-07-30 DIAGNOSIS — F331 Major depressive disorder, recurrent, moderate: Secondary | ICD-10-CM | POA: Diagnosis not present

## 2022-07-30 DIAGNOSIS — F411 Generalized anxiety disorder: Secondary | ICD-10-CM | POA: Diagnosis not present

## 2022-08-01 DIAGNOSIS — M2241 Chondromalacia patellae, right knee: Secondary | ICD-10-CM | POA: Diagnosis not present

## 2022-08-01 DIAGNOSIS — M25562 Pain in left knee: Secondary | ICD-10-CM | POA: Diagnosis not present

## 2022-08-01 DIAGNOSIS — M25561 Pain in right knee: Secondary | ICD-10-CM | POA: Diagnosis not present

## 2022-08-01 DIAGNOSIS — M2480 Other specific joint derangements of unspecified joint, not elsewhere classified: Secondary | ICD-10-CM | POA: Diagnosis not present

## 2022-08-02 DIAGNOSIS — F411 Generalized anxiety disorder: Secondary | ICD-10-CM | POA: Diagnosis not present

## 2022-08-02 DIAGNOSIS — F331 Major depressive disorder, recurrent, moderate: Secondary | ICD-10-CM | POA: Diagnosis not present

## 2022-08-09 DIAGNOSIS — F411 Generalized anxiety disorder: Secondary | ICD-10-CM | POA: Diagnosis not present

## 2022-08-09 DIAGNOSIS — F331 Major depressive disorder, recurrent, moderate: Secondary | ICD-10-CM | POA: Diagnosis not present

## 2022-08-12 DIAGNOSIS — M2480 Other specific joint derangements of unspecified joint, not elsewhere classified: Secondary | ICD-10-CM | POA: Diagnosis not present

## 2022-08-16 DIAGNOSIS — F411 Generalized anxiety disorder: Secondary | ICD-10-CM | POA: Diagnosis not present

## 2022-08-16 DIAGNOSIS — F331 Major depressive disorder, recurrent, moderate: Secondary | ICD-10-CM | POA: Diagnosis not present

## 2022-08-19 ENCOUNTER — Encounter: Payer: Self-pay | Admitting: Nurse Practitioner

## 2022-08-19 ENCOUNTER — Ambulatory Visit: Payer: BC Managed Care – PPO | Admitting: Nurse Practitioner

## 2022-08-19 VITALS — BP 110/73 | HR 86 | Temp 97.2°F | Resp 20 | Ht 66.0 in | Wt 224.0 lb

## 2022-08-19 DIAGNOSIS — M25552 Pain in left hip: Secondary | ICD-10-CM

## 2022-08-19 DIAGNOSIS — M25551 Pain in right hip: Secondary | ICD-10-CM | POA: Diagnosis not present

## 2022-08-19 MED ORDER — METHYLPREDNISOLONE ACETATE 80 MG/ML IJ SUSP
80.0000 mg | Freq: Once | INTRAMUSCULAR | Status: AC
  Administered 2022-08-19: 80 mg via INTRAMUSCULAR

## 2022-08-19 MED ORDER — KETOROLAC TROMETHAMINE 60 MG/2ML IM SOLN
60.0000 mg | Freq: Once | INTRAMUSCULAR | Status: AC
  Administered 2022-08-19: 60 mg via INTRAMUSCULAR

## 2022-08-19 NOTE — Patient Instructions (Signed)
Hip Pain The hip is the joint between the upper legs and the lower pelvis. The bones, cartilage, tendons, and muscles of your hip joint support your body and allow you to move around. Hip pain can range from a minor ache to severe pain in one or both of your hips. The pain may be felt on the inside of the hip joint near the groin, or on the outside near the buttocks and upper thigh. You may also have swelling or stiffness in your hip area. Follow these instructions at home: Managing pain, stiffness, and swelling     If directed, put ice on the painful area. To do this: Put ice in a plastic bag. Place a towel between your skin and the bag. Leave the ice on for 20 minutes, 2-3 times a day. If directed, apply heat to the affected area as often as told by your health care provider. Use the heat source that your health care provider recommends, such as a moist heat pack or a heating pad. Place a towel between your skin and the heat source. Leave the heat on for 20-30 minutes. Remove the heat if your skin turns bright red. This is especially important if you are unable to feel pain, heat, or cold. You may have a greater risk of getting burned. Activity Do exercises as told by your health care provider. Avoid activities that cause pain. General instructions  Take over-the-counter and prescription medicines only as told by your health care provider. Keep a journal of your symptoms. Write down: How often you have hip pain. The location of your pain. What the pain feels like. What makes the pain worse. Sleep with a pillow between your legs on your most comfortable side. Keep all follow-up visits as told by your health care provider. This is important. Contact a health care provider if: You cannot put weight on your leg. Your pain or swelling continues or gets worse after one week. It gets harder to walk. You have a fever. Get help right away if: You fall. You have a sudden increase in pain  and swelling in your hip. Your hip is red or swollen or very tender to touch. Summary Hip pain can range from a minor ache to severe pain in one or both of your hips. The pain may be felt on the inside of the hip joint near the groin, or on the outside near the buttocks and upper thigh. Avoid activities that cause pain. Write down how often you have hip pain, the location of the pain, what makes it worse, and what it feels like. This information is not intended to replace advice given to you by your health care provider. Make sure you discuss any questions you have with your health care provider. Document Revised: 11/09/2018 Document Reviewed: 11/09/2018 Elsevier Patient Education  2023 [employer]  

## 2022-08-19 NOTE — Progress Notes (Signed)
   Subjective:    Patient ID: Christine Pope, female    DOB: 07/04/2001, 22 y.o.   MRN: [id number]   Chief Complaint: bilateral hip pain (Since Thursday/Taking Gabapentin and that helps some but keeps coming back/)   Patient dx with hypermobility and always has some pain. Her hip pain started on Thursday. She has been doing stretches which has not helped. Rates pain 3-4/10 but can get as high as 9/10. Seems to be worse at night. She is on gabapentin and has taken motrin and tylenol which has not helped.        Review of Systems  Musculoskeletal:  Positive for arthralgias (bil hip pain).       Objective:   Physical Exam Vitals reviewed.  Constitutional:      Appearance: Normal appearance.  Cardiovascular:     Rate and Rhythm: Normal rate and regular rhythm.     Heart sounds: Normal heart sounds.  Pulmonary:     Breath sounds: Normal breath sounds.  Musculoskeletal:     Comments: FROM pf bil hips with no change in current pain Strength equal bil  Skin:    General: Skin is warm.  Neurological:     General: No focal deficit present.     Mental Status: She is alert and oriented to person, place, and time.  Psychiatric:        Mood and Affect: Mood normal.        Behavior: Behavior normal.     BP 110/73   Pulse 86   Temp (!) 97.2 F (36.2 C) (Temporal)   Resp 20   Ht 5\' 6"  (1.676 m)   Wt 224 lb (101.6 kg)   SpO2 94%   BMI 36.15 kg/m        Assessment & Plan:   Christine Pope in today with chief complaint of bilateral hip pain (Since Thursday/Taking Gabapentin and that helps some but keeps coming back/)   1. Bilateral hip pain Moist heat - ketorolac (TORADOL) injection 60 mg - methylPREDNISolone acetate (DEPO-MEDROL) injection 80 mg  Orders Placed This Encounter  Procedures   Ambulatory referral to Orthopedic Surgery    Referral Priority:   Routine    Referral Type:   Surgical    Referral Reason:   Specialty Services Required    Requested Specialty:    Orthopedic Surgery    Number of Visits Requested:   1     The above assessment and management plan was discussed with the patient. The patient verbalized understanding of and has agreed to the management plan. Patient is aware to call the clinic if symptoms persist or worsen. Patient is aware when to return to the clinic for a follow-up visit. Patient educated on when it is appropriate to go to the emergency department.   Mary-Margaret Hassell Done, FNP

## 2022-08-26 ENCOUNTER — Encounter: Payer: Self-pay | Admitting: Family Medicine

## 2022-08-26 ENCOUNTER — Ambulatory Visit: Payer: BC Managed Care – PPO | Admitting: Family Medicine

## 2022-08-26 VITALS — BP 105/63 | HR 88 | Temp 98.3°F | Ht 66.0 in | Wt 218.5 lb

## 2022-08-26 DIAGNOSIS — R300 Dysuria: Secondary | ICD-10-CM | POA: Diagnosis not present

## 2022-08-26 NOTE — Progress Notes (Signed)
   Acute Office Visit  Subjective:     Patient ID: Christine Pope, female    DOB: [birth year], 22 y.o.   MRN: [id number]  Chief Complaint  Patient presents with   Dysuria    Dysuria  This is a new problem. Episode onset: 4 days. The problem occurs intermittently. The problem has been gradually improving. The quality of the pain is described as burning. There has been no fever. Associated symptoms include hesitancy and urgency. Pertinent negatives include no chills, discharge, flank pain, frequency, hematuria, nausea, possible pregnancy or vomiting. She has tried increased fluids (cranberry) for the symptoms. The treatment provided mild relief.    Review of Systems  Constitutional:  Negative for chills.  Gastrointestinal:  Negative for nausea and vomiting.  Genitourinary:  Positive for dysuria, hesitancy and urgency. Negative for flank pain, frequency and hematuria.        Objective:    BP 105/63   Pulse 88   Temp 98.3 F (36.8 C) (Temporal)   Ht 5' 6"$  (1.676 m)   Wt 218 lb 8 oz (99.1 kg)   SpO2 94%   BMI 35.27 kg/m    Physical Exam Vitals and nursing note reviewed.  Constitutional:      General: She is not in acute distress.    Appearance: She is not ill-appearing, toxic-appearing or diaphoretic.  Cardiovascular:     Rate and Rhythm: Normal rate and regular rhythm.     Heart sounds: Normal heart sounds. No murmur heard. Pulmonary:     Effort: Pulmonary effort is normal. No respiratory distress.     Breath sounds: Normal breath sounds. No wheezing.  Abdominal:     General: Bowel sounds are normal. There is no distension.     Tenderness: There is no abdominal tenderness. There is no right CVA tenderness, left CVA tenderness, guarding or rebound.  Musculoskeletal:     Right lower leg: No edema.     Left lower leg: No edema.  Skin:    General: Skin is warm and dry.  Neurological:     General: No focal deficit present.     Mental Status: She is alert and oriented  to person, place, and time.  Psychiatric:        Mood and Affect: Mood normal.        Behavior: Behavior normal.     No results found for any visits on 08/26/22.      Assessment & Plan:   Kayren was seen today for dysuria.  Diagnoses and all orders for this visit:  Dysuria Symptoms improving with fluids and cranberry. She was unable to void today at the appointment. She will return with a specimen for testing as below. Will notify when available. Return to office for new or worsening symptoms, or if symptoms persist.  -     Urine Culture; Future -     Urinalysis, Routine w reflex microscopic; Future  The patient indicates understanding of these issues and agrees with the plan.  Gwenlyn Perking, FNP

## 2022-08-27 DIAGNOSIS — F411 Generalized anxiety disorder: Secondary | ICD-10-CM | POA: Diagnosis not present

## 2022-08-27 DIAGNOSIS — F331 Major depressive disorder, recurrent, moderate: Secondary | ICD-10-CM | POA: Diagnosis not present

## 2022-08-28 ENCOUNTER — Other Ambulatory Visit: Payer: BC Managed Care – PPO

## 2022-08-28 DIAGNOSIS — R300 Dysuria: Secondary | ICD-10-CM | POA: Diagnosis not present

## 2022-08-29 LAB — URINALYSIS, ROUTINE W REFLEX MICROSCOPIC
Bilirubin, UA: NEGATIVE
Glucose, UA: NEGATIVE
Ketones, UA: NEGATIVE
Leukocytes,UA: NEGATIVE
Nitrite, UA: NEGATIVE
Protein,UA: NEGATIVE
RBC, UA: NEGATIVE
Specific Gravity, UA: >1.03 — ABNORMAL HIGH (ref 1.005–1.030)
Urobilinogen, Ur: 0.2 mg/dL (ref 0.2–1.0)
pH, UA: 6 (ref 5.0–7.5)

## 2022-08-30 LAB — URINE CULTURE

## 2022-09-03 DIAGNOSIS — F331 Major depressive disorder, recurrent, moderate: Secondary | ICD-10-CM | POA: Diagnosis not present

## 2022-09-03 DIAGNOSIS — F411 Generalized anxiety disorder: Secondary | ICD-10-CM | POA: Diagnosis not present

## 2022-09-05 DIAGNOSIS — M25552 Pain in left hip: Secondary | ICD-10-CM | POA: Diagnosis not present

## 2022-09-05 DIAGNOSIS — M25551 Pain in right hip: Secondary | ICD-10-CM | POA: Diagnosis not present

## 2022-09-13 DIAGNOSIS — F331 Major depressive disorder, recurrent, moderate: Secondary | ICD-10-CM | POA: Diagnosis not present

## 2022-09-13 DIAGNOSIS — F411 Generalized anxiety disorder: Secondary | ICD-10-CM | POA: Diagnosis not present

## 2022-09-20 DIAGNOSIS — F331 Major depressive disorder, recurrent, moderate: Secondary | ICD-10-CM | POA: Diagnosis not present

## 2022-09-20 DIAGNOSIS — F411 Generalized anxiety disorder: Secondary | ICD-10-CM | POA: Diagnosis not present

## 2022-10-01 DIAGNOSIS — M357 Hypermobility syndrome: Secondary | ICD-10-CM | POA: Diagnosis not present

## 2022-10-10 DIAGNOSIS — M357 Hypermobility syndrome: Secondary | ICD-10-CM | POA: Diagnosis not present

## 2022-10-11 DIAGNOSIS — F331 Major depressive disorder, recurrent, moderate: Secondary | ICD-10-CM | POA: Diagnosis not present

## 2022-10-11 DIAGNOSIS — F411 Generalized anxiety disorder: Secondary | ICD-10-CM | POA: Diagnosis not present

## 2022-10-18 DIAGNOSIS — F411 Generalized anxiety disorder: Secondary | ICD-10-CM | POA: Diagnosis not present

## 2022-10-18 DIAGNOSIS — F331 Major depressive disorder, recurrent, moderate: Secondary | ICD-10-CM | POA: Diagnosis not present

## 2022-10-24 DIAGNOSIS — M357 Hypermobility syndrome: Secondary | ICD-10-CM | POA: Diagnosis not present

## 2022-10-25 DIAGNOSIS — F331 Major depressive disorder, recurrent, moderate: Secondary | ICD-10-CM | POA: Diagnosis not present

## 2022-10-25 DIAGNOSIS — F411 Generalized anxiety disorder: Secondary | ICD-10-CM | POA: Diagnosis not present

## 2022-10-28 DIAGNOSIS — E611 Iron deficiency: Secondary | ICD-10-CM | POA: Diagnosis not present

## 2022-10-29 ENCOUNTER — Ambulatory Visit (INDEPENDENT_AMBULATORY_CARE_PROVIDER_SITE_OTHER): Payer: BC Managed Care – PPO | Admitting: Family Medicine

## 2022-10-29 ENCOUNTER — Encounter: Payer: Self-pay | Admitting: Family Medicine

## 2022-10-29 VITALS — BP 113/72 | HR 95 | Temp 98.2°F | Ht 66.01 in | Wt 216.8 lb

## 2022-10-29 DIAGNOSIS — M249 Joint derangement, unspecified: Secondary | ICD-10-CM | POA: Diagnosis not present

## 2022-10-29 DIAGNOSIS — M357 Hypermobility syndrome: Secondary | ICD-10-CM

## 2022-10-29 NOTE — Progress Notes (Signed)
Subjective:  Patient ID: Christine Pope, female    DOB: 2001-04-07, 22 y.o.   MRN: [id number]  Patient Care Team: Bennie Pierini, FNP as PCP - General (Nurse Practitioner)   Chief Complaint:  Referral (Ortho - for hypermobile hands )   HPI: Christine Pope is a 22 y.o. female presenting on 10/29/2022 for Referral (Ortho - for hypermobile hands )   Pt presents today to get a referral to [facility] for hand splints. She has articular hypermobility and is followed by Ortho and OT at [facility]. States plastic hand braces were placed by OT but she needs silver ring braces. States she was told she needed a referral to [facility] to get these braces. I have reviewed her OT and ortho notes, no mention of this in notes. Pt still would like referral.     Relevant past medical, surgical, family, and social history reviewed and updated as indicated.  Allergies and medications reviewed and updated. Data reviewed: Chart in Epic.   History reviewed. No pertinent past medical history.  Past Surgical History:  Procedure Laterality Date   ADENOIDECTOMY     TONSILLECTOMY      Social History   Socioeconomic History   Marital status: Single    Spouse name: Not on file   Number of children: Not on file   Years of education: Not on file   Highest education level: Not on file  Occupational History   Not on file  Tobacco Use   Smoking status: Never   Smokeless tobacco: Never  Vaping Use   Vaping Use: Never used  Substance and Sexual Activity   Alcohol use: No   Drug use: No   Sexual activity: Not on file  Other Topics Concern   Not on file  Social History Narrative   Not on file   Social Determinants of Health   Financial Resource Strain: Not on file  Food Insecurity: Not on file  Transportation Needs: Not on file  Physical Activity: Not on file  Stress: Not on file  Social Connections: Not on file  Intimate Partner Violence: Not on file    Outpatient  Encounter Medications as of 10/29/2022  Medication Sig   ARIPiprazole (ABILIFY) 5 MG tablet Take by mouth.   dicyclomine (BENTYL) 10 MG capsule Take 10 mg by mouth daily as needed for spasms.   escitalopram (LEXAPRO) 10 MG tablet Take by mouth.   gabapentin (NEURONTIN) 100 MG capsule Take 200 mg by mouth 2 (two) times daily.   ibuprofen (ADVIL) 600 MG tablet Take 1 tablet (600 mg total) by mouth every 8 (eight) hours as needed.   Norethindrone-Ethinyl Estradiol-Fe Biphas (LO LOESTRIN FE) 1 MG-10 MCG / 10 MCG tablet Take 1 tablet by mouth daily.   ondansetron (ZOFRAN-ODT) 4 MG disintegrating tablet Take 1 tablet (4 mg total) by mouth every 8 (eight) hours as needed for nausea or vomiting.   pantoprazole (PROTONIX) 20 MG tablet Take 1 tablet (20 mg total) by mouth daily.   No facility-administered encounter medications on file as of 10/29/2022.    No Known Allergies  Review of Systems  Constitutional:  Negative for activity change, appetite change, chills, fatigue and fever.  HENT: Negative.    Eyes: Negative.   Respiratory:  Negative for cough, chest tightness and shortness of breath.   Cardiovascular:  Negative for chest pain, palpitations and leg swelling.  Gastrointestinal:  Negative for blood in stool, constipation, diarrhea, nausea and vomiting.  Endocrine: Negative.  Genitourinary:  Negative for dysuria, frequency and urgency.  Musculoskeletal:        Hypermobile joints  Skin: Negative.   Allergic/Immunologic: Negative.   Neurological:  Negative for dizziness and headaches.  Hematological: Negative.   Psychiatric/Behavioral:  Negative for confusion, hallucinations, sleep disturbance and suicidal ideas.   All other systems reviewed and are negative.       Objective:  BP 113/72   Pulse 95   Temp 98.2 F (36.8 C) (Temporal)   Ht 5' 6.01" (1.677 m)   Wt 216 lb 12.8 oz (98.3 kg)   LMP 10/08/2022   SpO2 95%   BMI 34.98 kg/m    Wt Readings from Last 3 Encounters:   10/29/22 216 lb 12.8 oz (98.3 kg) (98 %, Z= 2.17)*  08/26/22 218 lb 8 oz (99.1 kg) (99 %, Z= 2.19)*  08/19/22 224 lb (101.6 kg) (99 %, Z= 2.25)*   * Growth percentiles are based on CDC (Girls, 2-20 Years) data.    Physical Exam Vitals and nursing note reviewed.  Constitutional:      General: She is not in acute distress.    Appearance: Normal appearance. She is well-developed and well-groomed. She is not ill-appearing, toxic-appearing or diaphoretic.  HENT:     Head: Normocephalic and atraumatic.     Jaw: There is normal jaw occlusion.     Right Ear: Hearing normal.     Left Ear: Hearing normal.     Nose: Nose normal.     Mouth/Throat:     Lips: Pink.     Mouth: Mucous membranes are moist.     Pharynx: Oropharynx is clear. Uvula midline.  Eyes:     General: Lids are normal.     Extraocular Movements: Extraocular movements intact.     Conjunctiva/sclera: Conjunctivae normal.     Pupils: Pupils are equal, round, and reactive to light.  Neck:     Thyroid: No thyroid mass, thyromegaly or thyroid tenderness.     Vascular: No carotid bruit or JVD.     Trachea: Trachea and phonation normal.  Cardiovascular:     Rate and Rhythm: Normal rate and regular rhythm.     Chest Wall: PMI is not displaced.     Pulses: Normal pulses.     Heart sounds: Normal heart sounds. No murmur heard.    No friction rub. No gallop.  Pulmonary:     Effort: Pulmonary effort is normal. No respiratory distress.     Breath sounds: Normal breath sounds. No wheezing.  Abdominal:     General: Bowel sounds are normal. There is no distension or abdominal bruit.     Palpations: Abdomen is soft. There is no hepatomegaly or splenomegaly.     Tenderness: There is no abdominal tenderness. There is no right CVA tenderness or left CVA tenderness.     Hernia: No hernia is present.     Comments: Striae to abdomen  Musculoskeletal:        General: Normal range of motion.     Cervical back: Normal range of motion and  neck supple.     Right lower leg: No edema.     Left lower leg: No edema.     Comments: Hypermobile joints of hands  Lymphadenopathy:     Cervical: No cervical adenopathy.  Skin:    General: Skin is warm and dry.     Capillary Refill: Capillary refill takes less than 2 seconds.     Coloration: Skin is not cyanotic, jaundiced or  pale.     Findings: No rash.     Comments: Striae to arms, back, and abdomen  Neurological:     General: No focal deficit present.     Mental Status: She is alert and oriented to person, place, and time.     Sensory: Sensation is intact.     Motor: Motor function is intact.     Coordination: Coordination is intact.     Gait: Gait is intact.     Deep Tendon Reflexes: Reflexes are normal and symmetric.  Psychiatric:        Attention and Perception: Attention and perception normal.        Mood and Affect: Mood and affect normal.        Speech: Speech normal.        Behavior: Behavior normal. Behavior is cooperative.        Thought Content: Thought content normal.        Cognition and Memory: Cognition and memory normal.        Judgment: Judgment normal.     Results for orders placed or performed in visit on 08/28/22  Urine Culture   Specimen: Urine   UR  Result Value Ref Range   Urine Culture, Routine Final report    Organism ID, Bacteria Comment   Urinalysis, Routine w reflex microscopic  Result Value Ref Range   Specific Gravity, UA >1.030 (H) 1.005 - 1.030   pH, UA 6.0 5.0 - 7.5   Color, UA Yellow Yellow   Appearance Ur Cloudy (A) Clear   Leukocytes,UA Negative Negative   Protein,UA Negative Negative/Trace   Glucose, UA Negative Negative   Ketones, UA Negative Negative   RBC, UA Negative Negative   Bilirubin, UA Negative Negative   Urobilinogen, Ur 0.2 0.2 - 1.0 mg/dL   Nitrite, UA Negative Negative       Pertinent labs & imaging results that were available during my care of the patient were reviewed by me and considered in my medical  decision making.  Assessment & Plan:  Christine Pope was seen today for referral.  Diagnoses and all orders for this visit:  Hypermobile joints Familial generalized articular hypermobility Has been seen by ortho and OT at [facility]. Wants a referral to [facility] to get silver ring splints for her hands. Referral placed today. -     Ambulatory referral to Orthopedic Surgery     Continue all other maintenance medications.  Follow up plan: Return if symptoms worsen or fail to improve.   Continue healthy lifestyle choices, including diet (rich in fruits, vegetables, and lean proteins, and low in salt and simple carbohydrates) and exercise (at least 30 minutes of moderate physical activity daily).   The above assessment and management plan was discussed with the patient. The patient verbalized understanding of and has agreed to the management plan. Patient is aware to call the clinic if they develop any new symptoms or if symptoms persist or worsen. Patient is aware when to return to the clinic for a follow-up visit. Patient educated on when it is appropriate to go to the emergency department.   Kari Baars, FNP-C [facility] [phone]

## 2022-11-07 DIAGNOSIS — M79641 Pain in right hand: Secondary | ICD-10-CM | POA: Diagnosis not present

## 2022-11-07 DIAGNOSIS — Z789 Other specified health status: Secondary | ICD-10-CM | POA: Diagnosis not present

## 2022-11-07 DIAGNOSIS — R531 Weakness: Secondary | ICD-10-CM | POA: Diagnosis not present

## 2022-11-07 DIAGNOSIS — M79642 Pain in left hand: Secondary | ICD-10-CM | POA: Diagnosis not present

## 2022-11-07 DIAGNOSIS — M357 Hypermobility syndrome: Secondary | ICD-10-CM | POA: Diagnosis not present

## 2022-11-08 ENCOUNTER — Ambulatory Visit: Payer: BC Managed Care – PPO | Admitting: Nurse Practitioner

## 2022-11-12 DIAGNOSIS — F411 Generalized anxiety disorder: Secondary | ICD-10-CM | POA: Diagnosis not present

## 2022-11-12 DIAGNOSIS — D508 Other iron deficiency anemias: Secondary | ICD-10-CM | POA: Diagnosis not present

## 2022-11-12 DIAGNOSIS — E611 Iron deficiency: Secondary | ICD-10-CM | POA: Diagnosis not present

## 2022-11-12 DIAGNOSIS — F331 Major depressive disorder, recurrent, moderate: Secondary | ICD-10-CM | POA: Diagnosis not present

## 2022-11-14 DIAGNOSIS — M79641 Pain in right hand: Secondary | ICD-10-CM | POA: Diagnosis not present

## 2022-11-14 DIAGNOSIS — M79642 Pain in left hand: Secondary | ICD-10-CM | POA: Diagnosis not present

## 2022-11-14 DIAGNOSIS — R531 Weakness: Secondary | ICD-10-CM | POA: Diagnosis not present

## 2022-11-14 DIAGNOSIS — M357 Hypermobility syndrome: Secondary | ICD-10-CM | POA: Diagnosis not present

## 2022-11-14 DIAGNOSIS — Z789 Other specified health status: Secondary | ICD-10-CM | POA: Diagnosis not present

## 2022-11-22 DIAGNOSIS — F331 Major depressive disorder, recurrent, moderate: Secondary | ICD-10-CM | POA: Diagnosis not present

## 2022-11-22 DIAGNOSIS — F411 Generalized anxiety disorder: Secondary | ICD-10-CM | POA: Diagnosis not present

## 2022-11-26 DIAGNOSIS — M2480 Other specific joint derangements of unspecified joint, not elsewhere classified: Secondary | ICD-10-CM | POA: Diagnosis not present

## 2022-11-27 DIAGNOSIS — E611 Iron deficiency: Secondary | ICD-10-CM | POA: Diagnosis not present

## 2022-11-27 DIAGNOSIS — D508 Other iron deficiency anemias: Secondary | ICD-10-CM | POA: Diagnosis not present

## 2022-12-03 DIAGNOSIS — F411 Generalized anxiety disorder: Secondary | ICD-10-CM | POA: Diagnosis not present

## 2022-12-03 DIAGNOSIS — F331 Major depressive disorder, recurrent, moderate: Secondary | ICD-10-CM | POA: Diagnosis not present

## 2022-12-04 DIAGNOSIS — E611 Iron deficiency: Secondary | ICD-10-CM | POA: Diagnosis not present

## 2022-12-04 DIAGNOSIS — D508 Other iron deficiency anemias: Secondary | ICD-10-CM | POA: Diagnosis not present

## 2022-12-05 DIAGNOSIS — M357 Hypermobility syndrome: Secondary | ICD-10-CM | POA: Diagnosis not present

## 2022-12-05 DIAGNOSIS — Z789 Other specified health status: Secondary | ICD-10-CM | POA: Diagnosis not present

## 2022-12-05 DIAGNOSIS — R531 Weakness: Secondary | ICD-10-CM | POA: Diagnosis not present

## 2022-12-05 DIAGNOSIS — M79642 Pain in left hand: Secondary | ICD-10-CM | POA: Diagnosis not present

## 2022-12-05 DIAGNOSIS — M79641 Pain in right hand: Secondary | ICD-10-CM | POA: Diagnosis not present

## 2022-12-06 DIAGNOSIS — F411 Generalized anxiety disorder: Secondary | ICD-10-CM | POA: Diagnosis not present

## 2022-12-06 DIAGNOSIS — F331 Major depressive disorder, recurrent, moderate: Secondary | ICD-10-CM | POA: Diagnosis not present

## 2022-12-11 DIAGNOSIS — E611 Iron deficiency: Secondary | ICD-10-CM | POA: Diagnosis not present

## 2022-12-13 DIAGNOSIS — F411 Generalized anxiety disorder: Secondary | ICD-10-CM | POA: Diagnosis not present

## 2022-12-13 DIAGNOSIS — F331 Major depressive disorder, recurrent, moderate: Secondary | ICD-10-CM | POA: Diagnosis not present

## 2022-12-18 DIAGNOSIS — E611 Iron deficiency: Secondary | ICD-10-CM | POA: Diagnosis not present

## 2022-12-20 DIAGNOSIS — F411 Generalized anxiety disorder: Secondary | ICD-10-CM | POA: Diagnosis not present

## 2022-12-20 DIAGNOSIS — F331 Major depressive disorder, recurrent, moderate: Secondary | ICD-10-CM | POA: Diagnosis not present

## 2022-12-31 DIAGNOSIS — F411 Generalized anxiety disorder: Secondary | ICD-10-CM | POA: Diagnosis not present

## 2022-12-31 DIAGNOSIS — F331 Major depressive disorder, recurrent, moderate: Secondary | ICD-10-CM | POA: Diagnosis not present

## 2023-01-24 DIAGNOSIS — F411 Generalized anxiety disorder: Secondary | ICD-10-CM | POA: Diagnosis not present

## 2023-01-24 DIAGNOSIS — F331 Major depressive disorder, recurrent, moderate: Secondary | ICD-10-CM | POA: Diagnosis not present

## 2023-01-27 DIAGNOSIS — Z3009 Encounter for other general counseling and advice on contraception: Secondary | ICD-10-CM | POA: Diagnosis not present

## 2023-01-27 DIAGNOSIS — Z3202 Encounter for pregnancy test, result negative: Secondary | ICD-10-CM | POA: Diagnosis not present

## 2023-04-17 DIAGNOSIS — N926 Irregular menstruation, unspecified: Secondary | ICD-10-CM | POA: Diagnosis not present

## 2023-04-17 DIAGNOSIS — Z3009 Encounter for other general counseling and advice on contraception: Secondary | ICD-10-CM | POA: Diagnosis not present

## 2023-05-29 DIAGNOSIS — F331 Major depressive disorder, recurrent, moderate: Secondary | ICD-10-CM | POA: Diagnosis not present

## 2023-05-29 DIAGNOSIS — F411 Generalized anxiety disorder: Secondary | ICD-10-CM | POA: Diagnosis not present

## 2023-06-18 DIAGNOSIS — F411 Generalized anxiety disorder: Secondary | ICD-10-CM | POA: Diagnosis not present

## 2023-06-18 DIAGNOSIS — F331 Major depressive disorder, recurrent, moderate: Secondary | ICD-10-CM | POA: Diagnosis not present

## 2023-06-26 DIAGNOSIS — F411 Generalized anxiety disorder: Secondary | ICD-10-CM | POA: Diagnosis not present

## 2023-06-26 DIAGNOSIS — F331 Major depressive disorder, recurrent, moderate: Secondary | ICD-10-CM | POA: Diagnosis not present

## 2023-06-30 DIAGNOSIS — F411 Generalized anxiety disorder: Secondary | ICD-10-CM | POA: Diagnosis not present

## 2023-06-30 DIAGNOSIS — F331 Major depressive disorder, recurrent, moderate: Secondary | ICD-10-CM | POA: Diagnosis not present
